# Patient Record
Sex: Female | Born: 1965 | Hispanic: Yes | Marital: Single
Health system: Southern US, Community
[De-identification: ages and names within clinical notes are randomized; demographics above are authoritative.]

---

## 2015-05-10 ENCOUNTER — Ambulatory Visit
Admission: RE | Admit: 2015-05-10 | Discharge: 2015-05-10 | Disposition: A | Discharge status: Home / Self Care | Payer: Self-pay | Source: Ambulatory Visit | Attending: Oncology | Admitting: Oncology

## 2015-05-10 ENCOUNTER — Ambulatory Visit: Payer: Self-pay | Attending: Oncology

## 2015-05-10 VITALS — BP 114/74 | HR 73 | Temp 96.7°F | Resp 18 | Ht 63.39 in | Wt 223.4 lb

## 2015-05-10 DIAGNOSIS — Z0000 Encounter for general adult medical examination without abnormal findings: Secondary | ICD-10-CM

## 2015-05-10 NOTE — Progress Notes (Signed)
Subjective:     Patient ID: Erica Alexander, female   DOB: 25-Jul-1966, 49 y.o.   MRN: [id number]  HPI   Review of Systems     Objective:   Physical Exam  Pulmonary/Chest: Right breast exhibits no inverted nipple, no mass, no nipple discharge, no skin change and no tenderness. Left breast exhibits no inverted nipple, no mass, no nipple discharge, no skin change and no tenderness. Breasts are symmetrical.       Assessment:     49 year old patient presents for [facility] clinic visit.  Patient screened, and meets [facility] eligibility.  Patient does not have insurance, Medicare or Medicaid.  Handout given on Affordable Care Act.CBE unremarkable.  Instructed patient on breast self-exam using teach back method.  Sent for bilateral screening mammogram.

## 2015-05-12 NOTE — Progress Notes (Signed)
Patient ID: Erica Alexander, female   DOB: [birth year], 48 y.o.   MRN: [id number] Letter mailed from [facility] to notify of normal mammogram results.  Patient to return in one year for annual screening.Copy to [facility].

## 2017-09-03 ENCOUNTER — Ambulatory Visit: Payer: Self-pay

## 2017-09-04 ENCOUNTER — Ambulatory Visit: Payer: Self-pay | Attending: Oncology

## 2017-09-04 ENCOUNTER — Other Ambulatory Visit: Payer: Self-pay

## 2017-09-04 ENCOUNTER — Ambulatory Visit
Admission: RE | Admit: 2017-09-04 | Discharge: 2017-09-04 | Disposition: A | Discharge status: Home / Self Care | Payer: Self-pay | Source: Ambulatory Visit | Attending: Oncology | Admitting: Oncology

## 2017-09-04 VITALS — Ht 64.0 in | Wt 206.0 lb

## 2017-09-04 DIAGNOSIS — Z0000 Encounter for general adult medical examination without abnormal findings: Secondary | ICD-10-CM

## 2017-09-04 NOTE — Progress Notes (Signed)
Subjective:     Patient ID: Erica RedoPaula Alexander, female   DOB: 08-07-1966, 51 y.o.   MRN: [id number]  HPI   Review of Systems     Objective:   Physical Exam  Pulmonary/Chest: Right breast exhibits no inverted nipple, no mass, no nipple discharge, no skin change and no tenderness. Left breast exhibits no inverted nipple, no mass, no nipple discharge, no skin change and no tenderness. Breasts are symmetrical.       Assessment:     51 [facility] hispanic [facility].  Patient screened, and meets [facility] eligibility.  Patient does not have insurance, Medicare or Medicaid.  Handout given on Affordable Care Act.  Instructed patient on breast self-exam using teach back method.  CBE unremarkable.  No mass or lump palpated. Jaqui Laukaitis interpreted exam.    Plan:     Sent for bilateral screening mammogram.

## 2017-10-24 NOTE — Progress Notes (Signed)
Letter mailed from [facility] to notify of normal mammogram results.  Patient to return in one year for annual screening.  Copy to [facility]. 

## 2018-12-08 ENCOUNTER — Encounter: Payer: Self-pay | Admitting: *Deleted

## 2018-12-14 ENCOUNTER — Ambulatory Visit: Payer: Self-pay

## 2019-01-13 ENCOUNTER — Ambulatory Visit: Payer: Self-pay

## 2019-04-20 ENCOUNTER — Other Ambulatory Visit: Payer: Self-pay

## 2019-04-21 ENCOUNTER — Other Ambulatory Visit: Payer: Self-pay

## 2019-04-21 ENCOUNTER — Ambulatory Visit
Admission: RE | Admit: 2019-04-21 | Discharge: 2019-04-21 | Disposition: A | Discharge status: Home / Self Care | Payer: Self-pay | Source: Ambulatory Visit | Attending: Oncology | Admitting: Oncology

## 2019-04-21 ENCOUNTER — Encounter (INDEPENDENT_AMBULATORY_CARE_PROVIDER_SITE_OTHER): Payer: Self-pay

## 2019-04-21 ENCOUNTER — Ambulatory Visit: Payer: Self-pay | Attending: Oncology

## 2019-04-21 VITALS — BP 102/66 | HR 79 | Temp 98.6°F

## 2019-04-21 DIAGNOSIS — Z0000 Encounter for general adult medical examination without abnormal findings: Secondary | ICD-10-CM | POA: Insufficient documentation

## 2019-04-21 NOTE — Progress Notes (Signed)
  Subjective:     Patient ID: Erica Alexander, female   DOB: Sep 03, 1966, 53 y.o.   MRN: [id number]  HPI   Review of Systems     Objective:   Physical Exam Chest:     Breasts:        Right: No swelling, bleeding, inverted nipple, mass, nipple discharge, skin change or tenderness.        Left: No swelling, bleeding, inverted nipple, mass, nipple discharge, skin change or tenderness.  Genitourinary:    Labia:        Right: No rash, tenderness, lesion or injury.        Left: No rash, tenderness, lesion or injury.      Vagina: No signs of injury and foreign body. No vaginal discharge, erythema, tenderness, bleeding, lesions or prolapsed vaginal walls.     Cervix: No cervical motion tenderness, discharge, friability, lesion, erythema, cervical bleeding or eversion.     Uterus: Not deviated, not enlarged, not fixed, not tender and no uterine prolapse.      Adnexa:        Right: No mass, tenderness or fullness.         Left: No mass, tenderness or fullness.          Assessment:     53 year old hispanic patient presents for [facility] clinic visit.  Jaqui Laukaitis interpreted exam.  Patient screened, and meets [facility] eligibility.  Patient does not have insurance, Medicare or Medicaid. Instructed patient on breast self awareness using teach back method.  Clinical breast exam unremarkable. No mass or lump palpated.  Pelvic exam normal.      Plan:     Sent for bilateral screening mammogram.  Specimen collected for pap.

## 2019-04-29 LAB — PAP LB AND HPV HIGH-RISK: HPV, high-risk: POSITIVE — AB

## 2019-07-30 NOTE — Progress Notes (Signed)
Letter mailed to patient to notify of normal mammogram, and  Negative pap smear results.  HPV was positive, so patient will require pap in one year.  Copy to [facility].

## 2019-11-22 DEATH — deceased

## 2020-04-19 ENCOUNTER — Ambulatory Visit: Payer: Self-pay | Attending: Oncology

## 2021-01-05 IMAGING — MG DIGITAL SCREENING BILATERAL MAMMOGRAM WITH TOMO AND CAD
6 of 10 series · 6 of 30 positions shown · non-contrast
Comparison: Previous exam(s).

CLINICAL DATA: Screening.

EXAM:
DIGITAL SCREENING BILATERAL MAMMOGRAM WITH TOMO AND CAD

[L MLO synth-2D]
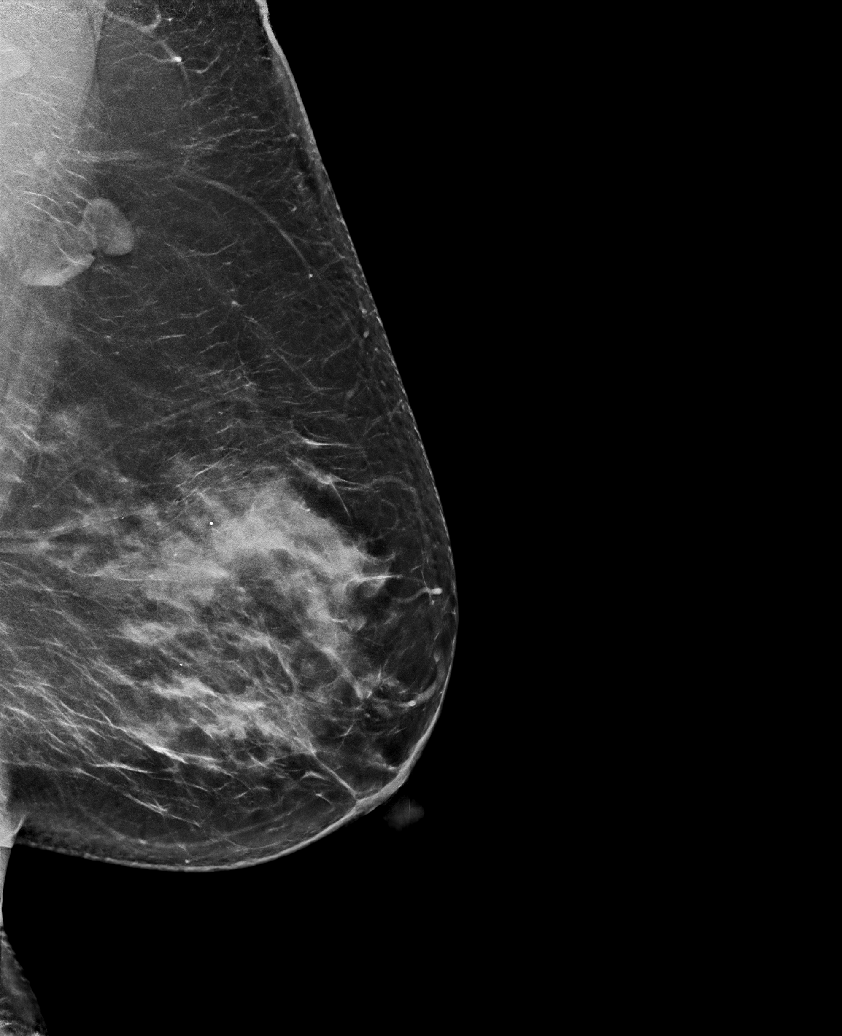

[R MLO synth-2D]
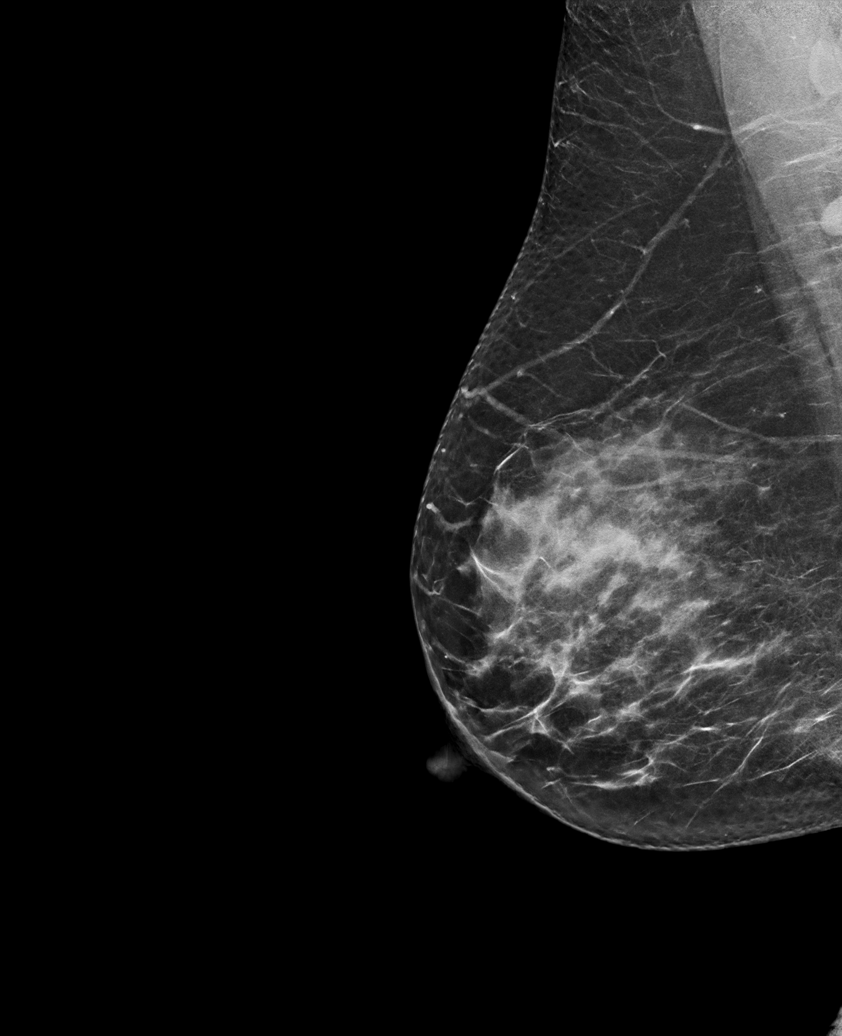

[L CC synth-2D (1 of 2)]
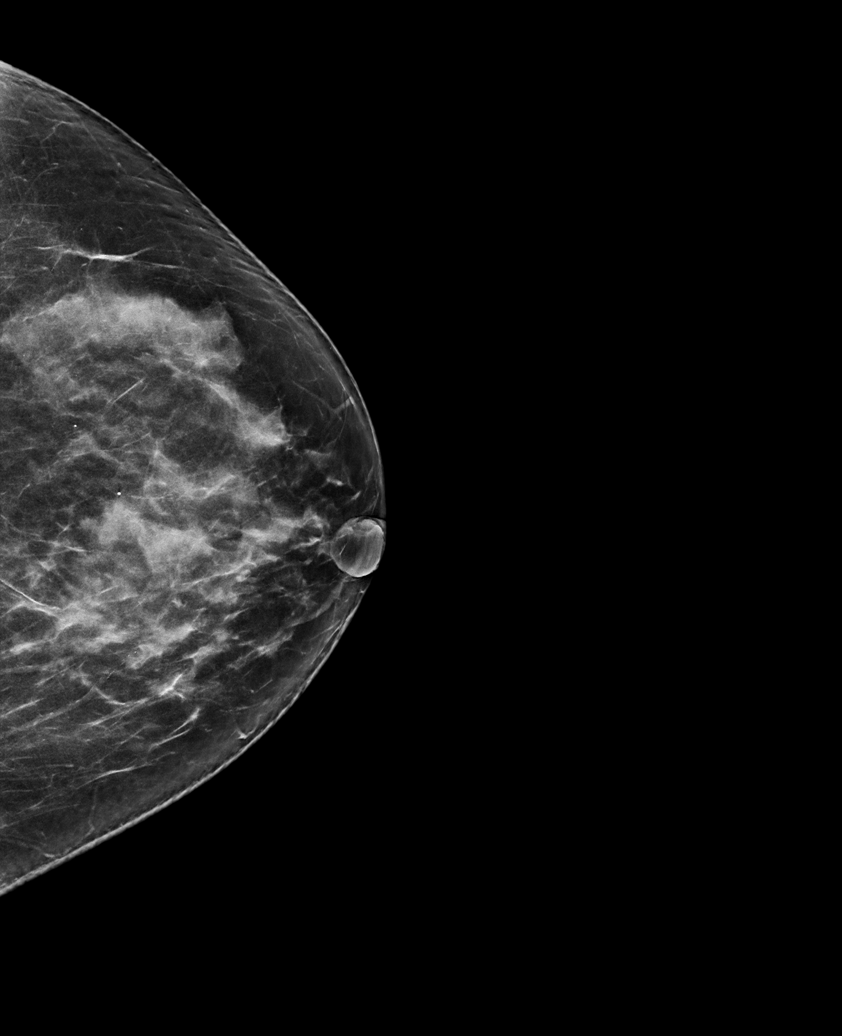

[R CC synth-2D]
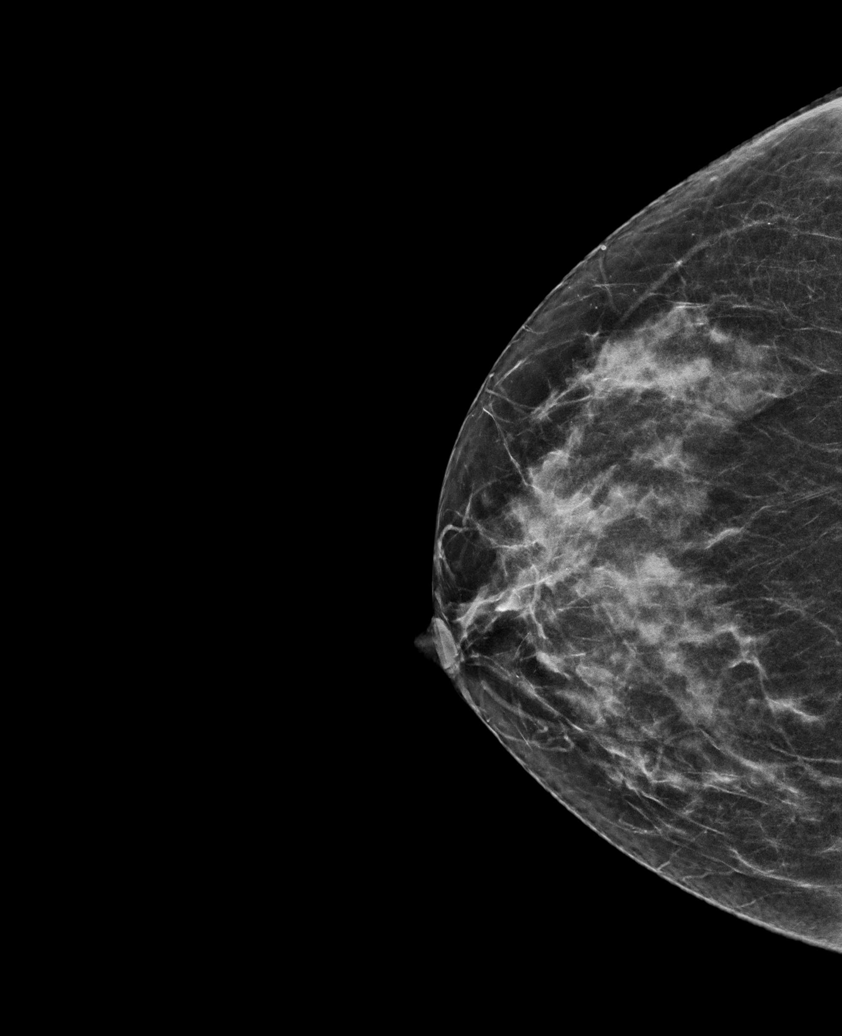

[L CC synth-2D (2 of 2)]
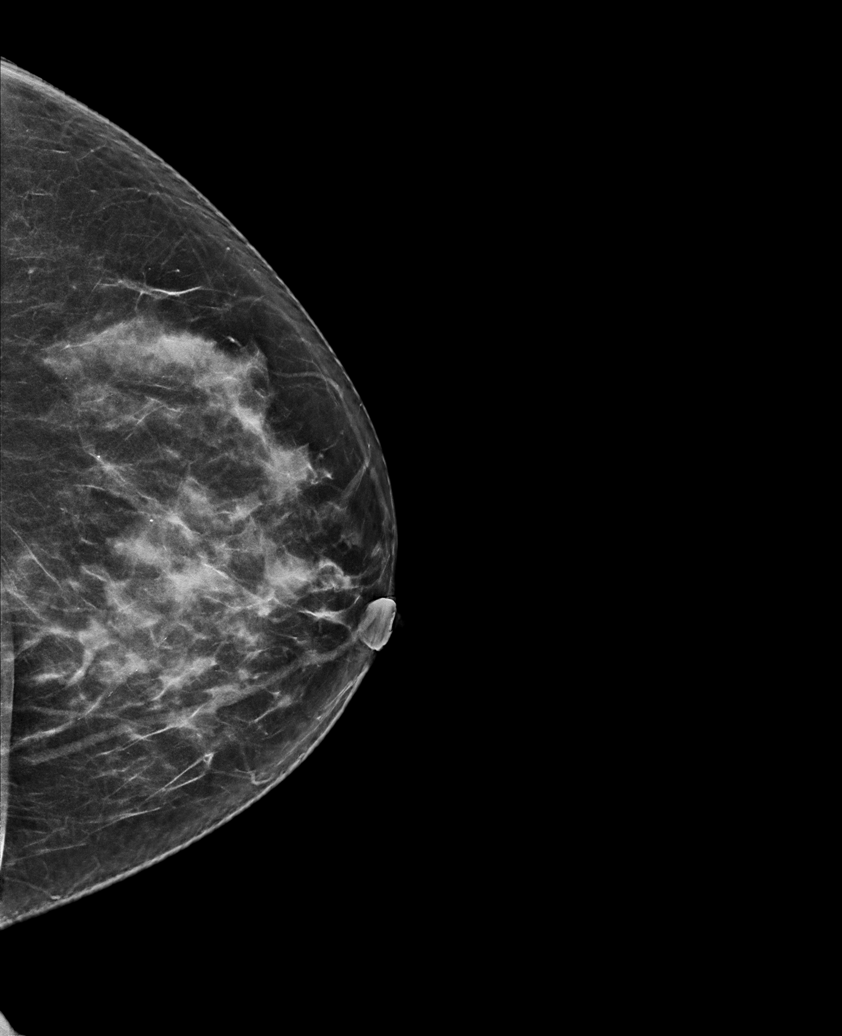

[L CC tomo · tomo slice 36/71.0]
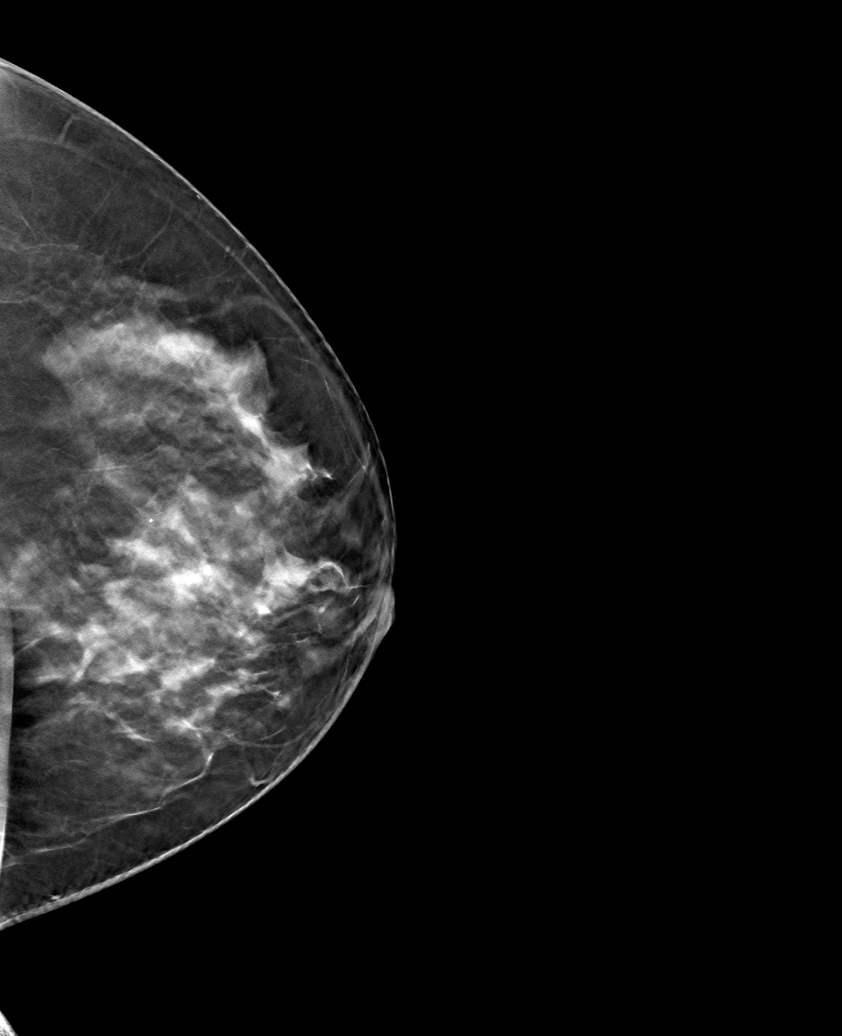

[6 of 30 positions shown; findings below may reference images not displayed]

ACR Breast Density Category c: The breast tissue is heterogeneously
dense, which may obscure small masses.
FINDINGS: There are no findings suspicious for malignancy. Images were
processed with CAD.
IMPRESSION: No mammographic evidence of malignancy. A result letter of this
screening mammogram will be mailed directly to the patient.

RECOMMENDATION:
Screening mammogram in one year. (Code:FT-U-LHB)

BI-RADS CATEGORY  1: Negative.
